# Patient Record
Sex: Male | Born: 1995 | Race: White | Hispanic: No | Marital: Single | Smoking: Current every day smoker
Health system: Southern US, Community
[De-identification: ages and names within clinical notes are randomized; demographics above are authoritative.]

## PROBLEM LIST (undated history)

## (undated) DIAGNOSIS — T8859XA Other complications of anesthesia, initial encounter: Secondary | ICD-10-CM

## (undated) DIAGNOSIS — L709 Acne, unspecified: Secondary | ICD-10-CM

## (undated) HISTORY — PX: WISDOM TOOTH EXTRACTION: SHX21

## (undated) HISTORY — DX: Acne, unspecified: L70.9

---
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
## 2015-08-21 ENCOUNTER — Ambulatory Visit (INDEPENDENT_AMBULATORY_CARE_PROVIDER_SITE_OTHER): Payer: Managed Care, Other (non HMO) | Admitting: Family Medicine

## 2015-08-21 VITALS — BP 122/68 | HR 95 | Temp 98.8°F | Resp 18 | Ht 71.5 in | Wt 151.0 lb

## 2015-08-21 DIAGNOSIS — Z720 Tobacco use: Secondary | ICD-10-CM | POA: Diagnosis not present

## 2015-08-21 DIAGNOSIS — K5900 Constipation, unspecified: Secondary | ICD-10-CM

## 2015-08-21 DIAGNOSIS — K640 First degree hemorrhoids: Secondary | ICD-10-CM

## 2015-08-21 NOTE — Progress Notes (Signed)
Subjective:    Patient ID: Matthew Galvan, male    DOB: 18-Feb-1996, 20 y.o.   MRN: [id number]  HPI This is a 20 yo male who presents today with hemorrhoid and constipation. He has had constipation for years. Took medication when he was young, now treats with high fiber foods. Has small BM most days, but can skip a day without BM. Good water intake. Occasional abdominal pain with constipation. Strains frequently. Rarely sees blood on stool. No tarry stools. No pain with bowel movements. Today he felt like he had pressure around his rectum and he looked with a mirror and saw buldging when he bore down. This caused him a great deal of anxiety and he passed out briefly at work. He reports he passed out at the ED the last time he had blood drawn. He gets very anxious regarding his health and possible illness.   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 He lives with his grandparents, his grandfather has been in poor health. His grandmother does the grocery shopping and he does most of the cooking. He eats fruits and vegetables, but not every day.   He is on probation and recently finished his community service. He is glad to be back to work full time. He is engaged.   Past Medical History  Diagnosis Date  . Acne    History reviewed. No pertinent past surgical history. History reviewed. No pertinent family history.  Social History  Substance Use Topics  . Smoking status: Current Every Day Smoker -- 0.50 packs/day    Types: Cigarettes  . Smokeless tobacco: None  . Alcohol Use: No    Review of Systems  Constitutional: Negative for fever, activity change, appetite change, fatigue and unexpected weight change.  Respiratory: Negative for shortness of breath.   Cardiovascular: Negative for chest pain.  Gastrointestinal: Positive for abdominal pain (occasionally with constipation) and constipation. Negative for nausea, vomiting and rectal pain.  Neurological: Negative  for dizziness, light-headedness and headaches.  Psychiatric/Behavioral: The patient is nervous/anxious.        Objective:   Physical Exam  Constitutional: He is oriented to person, place, and time. He appears well-developed and well-nourished.  HENT:  Galvan: Normocephalic and atraumatic.  Eyes: Conjunctivae are normal.  Neck: Normal range of motion. Neck supple.  Cardiovascular: Normal rate, regular rhythm and normal heart sounds.   Pulmonary/Chest: Effort normal and breath sounds normal.  Abdominal: Soft. Bowel sounds are normal. He exhibits no distension and no mass. There is no tenderness. There is no rebound and no guarding.  Genitourinary:  Small, soft external hemorrhoid.    Musculoskeletal: Normal range of motion.  Neurological: He is alert and oriented to person, place, and time.  Skin: Skin is warm and dry.  Psychiatric: He has a normal mood and affect. His behavior is normal. Judgment and thought content normal.  Vitals reviewed.     BP 122/68 mmHg  Pulse 95  Temp(Src) 98.8 F (37.1 C) (Oral)  Resp 18  Ht 5' 11.5" (1.816 m)  Wt 151 lb (68.493 kg)  BMI 20.77 kg/m2  SpO2 97%    Assessment & Plan:  1. First degree hemorrhoids - discussed causes and encouraged him to avoid constipation  2. Tobacco abuse - encouraged smoking cessation and provided written information regarding tips for success.   3. Constipation, unspecified constipation type - Provided written and verbal information regarding diagnosis and treatment. - encouraged him to take daily fiber to get  on better elimination scheduled, discussed ways to improve constipation - suggested labs, patient declined  - follow up PRN  Olean Reeeborah Weylin Plagge, FNP-BC  [facility]  08/24/2015 8:13 AM

## 2015-08-21 NOTE — Patient Instructions (Addendum)
Avoid getting constipated- drink at least 8 glasses of water daily, eat fruits and vegetables every day. Add fiber- can take Benefiber (generic is fine) that you mix in a glass of water. Take daily to see if you have improvement.   Manage stress- get enough sleep, eat regular healthy meals, exercise 4-5 times a week, take time for the things you enjoy  Consider picking a stop smoking date  Smoking Cessation, Tips for Success If you are ready to quit smoking, congratulations! You have chosen to help yourself be healthier. Cigarettes bring nicotine, tar, carbon monoxide, and other irritants into your body. Your lungs, heart, and blood vessels will be able to work better without these poisons. There are many different ways to quit smoking. Nicotine gum, nicotine patches, a nicotine inhaler, or nicotine nasal spray can help with physical craving. Hypnosis, support groups, and medicines help break the habit of smoking. WHAT THINGS CAN I DO TO MAKE QUITTING EASIER?  Here are some tips to help you quit for good:  Pick a date when you will quit smoking completely. Tell all of your friends and family about your plan to quit on that date.  Do not try to slowly cut down on the number of cigarettes you are smoking. Pick a quit date and quit smoking completely starting on that day.  Throw away all cigarettes.   Clean and remove all ashtrays from your home, work, and car.  On a card, write down your reasons for quitting. Carry the card with you and read it when you get the urge to smoke.  Cleanse your body of nicotine. Drink enough water and fluids to keep your urine clear or pale yellow. Do this after quitting to flush the nicotine from your body.  Learn to predict your moods. Do not let a bad situation be your excuse to have a cigarette. Some situations in your life might tempt you into wanting a cigarette.  Never have "just one" cigarette. It leads to wanting another and another. Remind yourself of  your decision to quit.  Change habits associated with smoking. If you smoked while driving or when feeling stressed, try other activities to replace smoking. Stand up when drinking your coffee. Brush your teeth after eating. Sit in a different chair when you read the paper. Avoid alcohol while trying to quit, and try to drink fewer caffeinated beverages. Alcohol and caffeine may urge you to smoke.  Avoid foods and drinks that can trigger a desire to smoke, such as sugary or spicy foods and alcohol.  Ask people who smoke not to smoke around you.  Have something planned to do right after eating or having a cup of coffee. For example, plan to take a walk or exercise.  Try a relaxation exercise to calm you down and decrease your stress. Remember, you may be tense and nervous for the first 2 weeks after you quit, but this will pass.  Find new activities to keep your hands busy. Play with a pen, coin, or rubber band. Doodle or draw things on paper.  Brush your teeth right after eating. This will help cut down on the craving for the taste of tobacco after meals. You can also try mouthwash.   Use oral substitutes in place of cigarettes. Try using lemon drops, carrots, cinnamon sticks, or chewing gum. Keep them handy so they are available when you have the urge to smoke.  When you have the urge to smoke, try deep breathing.  Designate your home  as a nonsmoking area.  If you are a heavy smoker, ask your health care provider about a prescription for nicotine chewing gum. It can ease your withdrawal from nicotine.  Reward yourself. Set aside the cigarette money you save and buy yourself something nice.  Look for support from others. Join a support group or smoking cessation program. Ask someone at home or at work to help you with your plan to quit smoking.  Always ask yourself, "Do I need this cigarette or is this just a reflex?" Tell yourself, "Today, I choose not to smoke," or "I do not want to  smoke." You are reminding yourself of your decision to quit.  Do not replace cigarette smoking with electronic cigarettes (commonly called e-cigarettes). The safety of e-cigarettes is unknown, and some may contain harmful chemicals.  If you relapse, do not give up! Plan ahead and think about what you will do the next time you get the urge to smoke. HOW WILL I FEEL WHEN I QUIT SMOKING? You may have symptoms of withdrawal because your body is used to nicotine (the addictive substance in cigarettes). You may crave cigarettes, be irritable, feel very hungry, cough often, get headaches, or have difficulty concentrating. The withdrawal symptoms are only temporary. They are strongest when you first quit but will go away within 10-14 days. When withdrawal symptoms occur, stay in control. Think about your reasons for quitting. Remind yourself that these are signs that your body is healing and getting used to being without cigarettes. Remember that withdrawal symptoms are easier to treat than the major diseases that smoking can cause.  Even after the withdrawal is over, expect periodic urges to smoke. However, these cravings are generally short lived and will go away whether you smoke or not. Do not smoke! WHAT RESOURCES ARE AVAILABLE TO HELP ME QUIT SMOKING? Your health care provider can direct you to community resources or hospitals for support, which may include:  Group support.  Education.  Hypnosis.  Therapy.   This information is not intended to replace advice given to you by your health care provider. Make sure you discuss any questions you have with your health care provider.   Document Released: 12/19/2003 Document Revised: 04/12/2014 Document Reviewed: 09/07/2012 Elsevier Interactive Patient Education 2016 [employer].   Constipation, Adult Constipation is when a person has fewer than three bowel movements a week, has difficulty having a bowel movement, or has stools that are dry, hard,  or larger than normal. As people grow older, constipation is more common. A low-fiber diet, not taking in enough fluids, and taking certain medicines may make constipation worse.  CAUSES   Certain medicines, such as antidepressants, pain medicine, iron supplements, antacids, and water pills.   Certain diseases, such as diabetes, irritable bowel syndrome (IBS), thyroid disease, or depression.   Not drinking enough water.   Not eating enough fiber-rich foods.   Stress or travel.   Lack of physical activity or exercise.   Ignoring the urge to have a bowel movement.   Using laxatives too much.  SIGNS AND SYMPTOMS   Having fewer than three bowel movements a week.   Straining to have a bowel movement.   Having stools that are hard, dry, or larger than normal.   Feeling full or bloated.   Pain in the lower abdomen.   Not feeling relief after having a bowel movement.  DIAGNOSIS  Your health care provider will take a medical history and perform a physical exam. Further testing may  be done for severe constipation. Some tests may include:  A barium enema X-ray to examine your rectum, colon, and, sometimes, your small intestine.   A sigmoidoscopy to examine your lower colon.   A colonoscopy to examine your entire colon. TREATMENT  Treatment will depend on the severity of your constipation and what is causing it. Some dietary treatments include drinking more fluids and eating more fiber-rich foods. Lifestyle treatments may include regular exercise. If these diet and lifestyle recommendations do not help, your health care provider may recommend taking over-the-counter laxative medicines to help you have bowel movements. Prescription medicines may be prescribed if over-the-counter medicines do not work.  HOME CARE INSTRUCTIONS   Eat foods that have a lot of fiber, such as fruits, vegetables, whole grains, and beans.  Limit foods high in fat and processed sugars, such as  french fries, hamburgers, cookies, candies, and soda.   A fiber supplement may be added to your diet if you cannot get enough fiber from foods.   Drink enough fluids to keep your urine clear or pale yellow.   Exercise regularly or as directed by your health care provider.   Go to the restroom when you have the urge to go. Do not hold it.   Only take over-the-counter or prescription medicines as directed by your health care provider. Do not take other medicines for constipation without talking to your health care provider first.  SEEK IMMEDIATE MEDICAL CARE IF:   You have bright red blood in your stool.   Your constipation lasts for more than 4 days or gets worse.   You have abdominal or rectal pain.   You have thin, pencil-like stools.   You have unexplained weight loss. MAKE SURE YOU:   Understand these instructions.  Will watch your condition.  Will get help right away if you are not doing well or get worse.   This information is not intended to replace advice given to you by your health care provider. Make sure you discuss any questions you have with your health care provider.   Document Released: 12/19/2003 Document Revised: 04/12/2014 Document Reviewed: 01/01/2013 Elsevier Interactive Patient Education 2016 [employer].  Hemorrhoids Hemorrhoids are swollen veins around the rectum or anus. There are two types of hemorrhoids:   Internal hemorrhoids. These occur in the veins just inside the rectum. They may poke through to the outside and become irritated and painful.  External hemorrhoids. These occur in the veins outside the anus and can be felt as a painful swelling or hard lump near the anus. CAUSES  Pregnancy.   Obesity.   Constipation or diarrhea.   Straining to have a bowel movement.   Sitting for long periods on the toilet.  Heavy lifting or other activity that caused you to strain.  Anal intercourse. SYMPTOMS   Pain.   Anal  itching or irritation.   Rectal bleeding.   Fecal leakage.   Anal swelling.   One or more lumps around the anus.  DIAGNOSIS  Your caregiver may be able to diagnose hemorrhoids by visual examination. Other examinations or tests that may be performed include:   Examination of the rectal area with a gloved hand (digital rectal exam).   Examination of anal canal using a small tube (scope).   A blood test if you have lost a significant amount of blood.  A test to look inside the colon (sigmoidoscopy or colonoscopy). TREATMENT Most hemorrhoids can be treated at home. However, if symptoms do not seem  to be getting better or if you have a lot of rectal bleeding, your caregiver may perform a procedure to help make the hemorrhoids get smaller or remove them completely. Possible treatments include:   Placing a rubber band at the base of the hemorrhoid to cut off the circulation (rubber band ligation).   Injecting a chemical to shrink the hemorrhoid (sclerotherapy).   Using a tool to burn the hemorrhoid (infrared light therapy).   Surgically removing the hemorrhoid (hemorrhoidectomy).   Stapling the hemorrhoid to block blood flow to the tissue (hemorrhoid stapling).  HOME CARE INSTRUCTIONS   Eat foods with fiber, such as whole grains, beans, nuts, fruits, and vegetables. Ask your doctor about taking products with added fiber in them (fibersupplements).  Increase fluid intake. Drink enough water and fluids to keep your urine clear or pale yellow.   Exercise regularly.   Go to the bathroom when you have the urge to have a bowel movement. Do not wait.   Avoid straining to have bowel movements.   Keep the anal area dry and clean. Use wet toilet paper or moist towelettes after a bowel movement.   Medicated creams and suppositories may be used or applied as directed.   Only take over-the-counter or prescription medicines as directed by your caregiver.   Take warm  sitz baths for 15-20 minutes, 3-4 times a day to ease pain and discomfort.   Place ice packs on the hemorrhoids if they are tender and swollen. Using ice packs between sitz baths may be helpful.   Put ice in a plastic bag.   Place a towel between your skin and the bag.   Leave the ice on for 15-20 minutes, 3-4 times a day.   Do not use a donut-shaped pillow or sit on the toilet for long periods. This increases blood pooling and pain.  SEEK MEDICAL CARE IF:  You have increasing pain and swelling that is not controlled by treatment or medicine.  You have uncontrolled bleeding.  You have difficulty or you are unable to have a bowel movement.  You have pain or inflammation outside the area of the hemorrhoids. MAKE SURE YOU:  Understand these instructions.  Will watch your condition.  Will get help right away if you are not doing well or get worse.   This information is not intended to replace advice given to you by your health care provider. Make sure you discuss any questions you have with your health care provider.   Document Released: 03/19/2000 Document Revised: 03/08/2012 Document Reviewed: 01/25/2012 Elsevier Interactive Patient Education 2016 [employer]     IF you received an x-ray today, you will receive an invoice from [facility]. Please contact [facility] at [phone] with questions or concerns regarding your invoice.   IF you received labwork today, you will receive an invoice from [facility]. Please contact [facility] at [phone] with questions or concerns regarding your invoice.   Our billing staff will not be able to assist you with questions regarding bills from these companies.  You will be contacted with the lab results as soon as they are available. The fastest way to get your results is to activate your My Chart account. Instructions are located on the last page of this paperwork. If you have not  heard from us regarding the results in 2 weeks, please contact this office.    -
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
## 2019-10-29 ENCOUNTER — Encounter: Payer: Self-pay | Admitting: Emergency Medicine

## 2019-10-29 DIAGNOSIS — F17210 Nicotine dependence, cigarettes, uncomplicated: Secondary | ICD-10-CM | POA: Diagnosis not present

## 2019-10-29 DIAGNOSIS — K047 Periapical abscess without sinus: Secondary | ICD-10-CM | POA: Diagnosis not present

## 2019-10-29 DIAGNOSIS — R220 Localized swelling, mass and lump, head: Secondary | ICD-10-CM | POA: Diagnosis present

## 2019-10-29 NOTE — ED Triage Notes (Signed)
Pt c/o swelling underneath the top lip x1 day. Pt denies drainage as well as fever. Area is swollen with no redness.

## 2019-10-30 ENCOUNTER — Other Ambulatory Visit: Payer: Self-pay

## 2019-10-30 ENCOUNTER — Emergency Department
Admission: EM | Admit: 2019-10-30 | Discharge: 2019-10-30 | Disposition: A | Discharge status: Home / Self Care | Payer: No Typology Code available for payment source | Attending: Emergency Medicine | Admitting: Emergency Medicine

## 2019-10-30 DIAGNOSIS — K047 Periapical abscess without sinus: Secondary | ICD-10-CM

## 2019-10-30 MED ORDER — AMOXICILLIN 500 MG PO CAPS
500.0000 mg | ORAL_CAPSULE | Freq: Once | ORAL | Status: AC
  Administered 2019-10-30: 500 mg via ORAL
  Filled 2019-10-30: qty 1

## 2019-10-30 MED ORDER — TRAMADOL HCL 50 MG PO TABS: 50.0000 mg | ORAL_TABLET | Freq: Two times a day (BID) | ORAL | 0 refills | Status: AC

## 2019-10-30 MED ORDER — AMOXICILLIN 500 MG PO CAPS: 500.0000 mg | ORAL_CAPSULE | Freq: Three times a day (TID) | ORAL | 0 refills | Status: DC

## 2019-10-30 MED ORDER — IBUPROFEN 800 MG PO TABS: 800.0000 mg | ORAL_TABLET | Freq: Once | ORAL | Status: DC

## 2019-10-30 MED ORDER — ACETAMINOPHEN 325 MG PO TABS
650.0000 mg | ORAL_TABLET | Freq: Once | ORAL | Status: AC
  Administered 2019-10-30: 650 mg via ORAL
  Filled 2019-10-30: qty 2

## 2019-10-30 MED ORDER — LIDOCAINE-EPINEPHRINE 2 %-1:100000 IJ SOLN
1.7000 mL | Freq: Once | INTRAMUSCULAR | Status: AC
  Administered 2019-10-30: 1.7 mL

## 2019-10-30 NOTE — ED Provider Notes (Signed)
[facility] Emergency Department Provider Note ____________________________________________  Time seen: 0715  I have reviewed the triage vital signs and the nursing notes.  HISTORY  Chief Complaint  Oral Swelling  HPI Matthew Galvan is a 24 y.o. male presents himself to the ED for evaluation of dental pain and facial swelling.  Patient describes onset yesterday, of some swelling and fullness to the upper primary incisors just under the upper lip.  He denies any fever, chills, or sweats.  He also denies any difficulty swallowing, breathing, or controlling secretions.  Patient is without reports of recent dental work or facial trauma.  He denies any previous dental abscesses.   Past Medical History:  Diagnosis Date  . Acne     There are no problems to display for this patient.   No past surgical history on file.  Prior to Admission medications   Medication Sig Start Date End Date Taking? Authorizing Provider  amoxicillin (AMOXIL) 500 MG capsule Take 1 capsule (500 mg total) by mouth 3 (three) times daily. 10/30/19   Breyah Akhter, Charlesetta Ivory, PA-C  traMADol (ULTRAM) 50 MG tablet Take 1 tablet (50 mg total) by mouth 2 (two) times daily for 3 days. 10/30/19 11/02/19  Angenette Daily, Charlesetta Ivory, PA-C    Allergies Patient has no known allergies.  No family history on file.  Social History Social History   Tobacco Use  . Smoking status: Current Every Day Smoker    Packs/day: 0.50    Types: Cigarettes  Substance Use Topics  . Alcohol use: No  . Drug use: No    Review of Systems  Constitutional: Negative for fever. Eyes: Negative for visual changes. ENT: Negative for sore throat. Dental pain/facial swelling  Cardiovascular: Negative for chest pain. Respiratory: Negative for shortness of breath. Musculoskeletal: Negative for back pain. Skin: Negative for rash. Neurological: Negative for headaches, focal weakness or  numbness. ____________________________________________  PHYSICAL EXAM:  VITAL SIGNS: ED Triage Vitals [10/29/19 2117]  Enc Vitals Group     BP (!) 143/78     Pulse Rate 80     Resp 16     Temp 99.3 F (37.4 C)     Temp Source Oral     SpO2 99 %     Weight 160 lb (72.6 kg)     Height 6\' 1"  (1.854 m)     Head Circumference      Peak Flow      Pain Score      Pain Loc      Pain Edu?      Excl. in GC?     Constitutional: Alert and oriented. Well appearing and in no distress. Head: Normocephalic and atraumatic. Eyes: Conjunctivae are normal. PERRL. Normal extraocular movements Nose: No congestion/rhinorrhea/epistaxis. Mouth/Throat: Mucous membranes are moist.  Uvula is midline and tonsils are flat.  No oropharyngeal lesions are appreciated.  Patient with fullness and swelling to the buccal space at the upper frenulum.  No spontaneous purulent drainage is appreciated. Neck: Supple. No thyromegaly. Hematological/Lymphatic/Immunological: No cervical lymphadenopathy. Cardiovascular: Normal rate, regular rhythm. Normal distal pulses. Respiratory: Normal respiratory effort. No wheezes/rales/rhonchi. Gastrointestinal: Soft and nontender. No distention. Skin:  Skin is warm, dry and intact. No rash noted. ____________________________________________  PROCEDURES  Tylenol 650 mg PO Amoxicillin 500 mg PO  . Incision and Drainage  Date/Time: 10/30/2019 7:22 AM Performed by: 11/01/2019, PA-C Authorized by: Lissa Hoard, PA-C   Consent:    Consent obtained:  Verbal  Consent given by:  Patient   Risks discussed:  Bleeding and pain   Alternatives discussed:  Alternative treatment Location:    Type:  Abscess   Location:  Mouth   Mouth location:  Alveolar process Procedure type:    Complexity:  Simple Procedure details:    Needle aspiration: no     Incision types:  Stab incision   Incision depth:  Submucosal   Scalpel blade:  11   Drainage:  Bloody    Drainage amount:  Scant   Wound treatment:  Wound left open   Packing materials:  None Post-procedure details:    Patient tolerance of procedure:  Tolerated well, no immediate complications  ____________________________________________  INITIAL IMPRESSION / ASSESSMENT AND PLAN / ED COURSE  Patient with ED evaluation of facial swelling to the upper gumline. He was found to have a focal periapical abscess to the primary left incisor. A local I&D procedure was performed. He was discharged with amoxicillin and Ultram. He is referred to a local dental provider as needed.   LADALE SHERBURN was evaluated in Emergency Department on 10/30/2019 for the symptoms described in the history of present illness. He was evaluated in the context of the global COVID-19 pandemic, which necessitated consideration that the patient might be at risk for infection with the SARS-CoV-2 virus that causes COVID-19. Institutional protocols and algorithms that pertain to the evaluation of patients at risk for COVID-19 are in a state of rapid change based on information released by regulatory bodies including the CDC and federal and state organizations. These policies and algorithms were followed during the patient's care in the ED.  I reviewed the patient's prescription history over the last 12 months in the multi-state controlled substances database(s) that includes [place], [place], [place], [employer], [place], [place], [place], [place], [place], [place], [place], [place], [place], and [place].  Results were notable for no current RX. ____________________________________________  FINAL CLINICAL IMPRESSION(S) / ED DIAGNOSES  Final diagnoses:  Dental abscess      Lissa Hoard, PA-C 10/30/19 1503    Jene Every, MD 10/30/19 1506

## 2019-10-30 NOTE — Discharge Instructions (Signed)
Continue to rinse the mouth daily with warm salt water.  Use a soft toothbrush or clean his teeth twice daily.  Take antibiotic as directed and the pain medicine as needed.  Follow-up with a local dental provider for ongoing symptoms.  OPTIONS FOR DENTAL FOLLOW UP CARE  [place] [facility] and [facility] Services - Local Safety Net Dental Clinics TripDoors.com.htm   [facility] [phone])  [facility] [phone])  [place] [phone] ext [phone])  [facility] [phone])  [facility] [phone]) This clinic caters to the indigent population and is on a lottery system. Location: [employer] [facility], [employer], [address] [place] Clinic Hours: Wednesdays from 6pm - 9pm, patients seen by a lottery system. For dates, call or go to [email or web address] Services: Cleanings, fillings and simple extractions. Payment Options: DENTAL WORK IS FREE OF CHARGE. Bring proof of income or support. Best way to get seen: Arrive at 5:15 pm - this is a lottery, NOT first come/first serve, so arriving earlier will not increase your chances of being seen.     [facility] [phone] Select option 1 for emergencies   Location: [facility], [place], [address] [place] Clinic Hours: No walk-ins accepted - call the day before to schedule an appointment. Check in times are 9:30 am and 1:30 pm. Services: Simple extractions, temporary fillings, pulpectomy/pulp debridement, uncomplicated abscess drainage. Payment Options: PAYMENT IS DUE AT THE TIME OF SERVICE.  Fee is usually $100-200, additional surgical procedures (e.g. abscess drainage) may be extra. Cash, checks, Visa/MasterCard accepted.  Can file Medicaid if patient is covered for dental - patient should call case worker to check. No  discount for [facility] patients. Best way to get seen: MUST call the day before and get onto the schedule. Can usually be seen the next 1-2 days. No walk-ins accepted.     [facility] [phone]   Location: [facility], [address], [place] Clinic Hours: M, W, Th, F 8am or 1:30pm, Tues 9a or 1:30 - first come/first served. Services: Simple extractions, temporary fillings, uncomplicated abscess drainage.  You do not need to be an [facility] resident. Payment Options: PAYMENT IS DUE AT THE TIME OF SERVICE. Dental insurance, otherwise sliding scale - bring proof of income or support. Depending on income and treatment needed, cost is usually $50-200. Best way to get seen: Arrive early as it is first come/first served.     [facility] [phone]   Location: [address] Clinic Hours: Mon-Thu 8a-5p Services: Most basic dental services including extractions and fillings. Payment Options: PAYMENT IS DUE AT THE TIME OF SERVICE. Sliding scale, up to 50% off - bring proof if income or support. Medicaid with dental option accepted. Best way to get seen: Call to schedule an appointment, can usually be seen within 2 weeks OR they will try to see walk-ins - show up at 8a or 2p (you may have to wait).     [facility] [phone] [place] RESIDENTS ONLY   Location: [facility], [address] [place], [place] [address] Clinic Hours: By appointment only. Monday - Thursday 8am-5pm, Friday 8am-12pm Services: Cleanings, fillings, extractions. Payment Options: PAYMENT IS DUE AT THE TIME OF SERVICE. Cash, Visa or MasterCard. Sliding scale - $30 minimum per service. Best way to get seen: Come in to office, complete packet and make an  appointment - need proof of income or support monies for each household member and proof of [facility]  residence. Usually takes about a month to get in.     [facility] [phone]   Location: [address] [facility] Clinic Hours: Walk-in Urgent Care Dental Services are offered Monday-Friday mornings only. The numbers of emergencies accepted daily is limited to the number of providers available. Maximum 15 - Mondays, Wednesdays & Thursdays Maximum 10 - Tuesdays & Fridays Services: You do not need to be a [facility] resident to be seen for a dental emergency. Emergencies are defined as pain, swelling, abnormal bleeding, or dental trauma. Walkins will receive x-rays if needed. NOTE: Dental cleaning is not an emergency. Payment Options: PAYMENT IS DUE AT THE TIME OF SERVICE. Minimum co-pay is $40.00 for uninsured patients. Minimum co-pay is $3.00 for Medicaid with dental coverage. Dental Insurance is accepted and must be presented at time of visit. Medicare does not cover dental. Forms of payment: Cash, credit card, checks. Best way to get seen: If not previously registered with the clinic, walk-in dental registration begins at 7:15 am and is on a first come/first serve basis. If previously registered with the clinic, call to make an appointment.     [facility] [phone] [place] RESIDENTS ONLY   Location: [address] [place], [place] Clinic Hours: Mon-Thu 10a-2p Services: Extractions only! Payment Options: FREE (donations accepted) - bring proof of income or support Best way to get seen: Call and schedule an appointment OR come at 8am on the 1st Monday of every month (except for holidays) when it is first come/first served.     [facility] [phone]   Location: [address] [place], [place] Clinic Hours: Friday mornings Services, Payment Options, Best way to get seen: Call for info

## 2019-10-30 NOTE — ED Notes (Signed)
Pt sitting in lobby, sleeping soundly with no distress noted; pt updated on wait time; pt refuses tylenol when offered and st that he is leaving if he is not back in a room within 

## 2019-10-30 NOTE — ED Notes (Signed)
See triage note  Presents with pain and swelling to mid upper gumline  States pain started on Sunday  No fever

## 2021-04-11 ENCOUNTER — Encounter (HOSPITAL_BASED_OUTPATIENT_CLINIC_OR_DEPARTMENT_OTHER): Payer: Self-pay | Admitting: *Deleted

## 2021-04-11 ENCOUNTER — Other Ambulatory Visit: Payer: Self-pay

## 2021-04-11 ENCOUNTER — Emergency Department (HOSPITAL_BASED_OUTPATIENT_CLINIC_OR_DEPARTMENT_OTHER): Payer: No Typology Code available for payment source

## 2021-04-11 ENCOUNTER — Emergency Department (HOSPITAL_BASED_OUTPATIENT_CLINIC_OR_DEPARTMENT_OTHER)
Admission: EM | Admit: 2021-04-11 | Discharge: 2021-04-11 | Disposition: A | Discharge status: Home / Self Care | Payer: No Typology Code available for payment source | Attending: Emergency Medicine | Admitting: Emergency Medicine

## 2021-04-11 DIAGNOSIS — Z79899 Other long term (current) drug therapy: Secondary | ICD-10-CM | POA: Insufficient documentation

## 2021-04-11 DIAGNOSIS — S02602A Fracture of unspecified part of body of left mandible, initial encounter for closed fracture: Secondary | ICD-10-CM | POA: Diagnosis not present

## 2021-04-11 DIAGNOSIS — S0993XA Unspecified injury of face, initial encounter: Secondary | ICD-10-CM | POA: Diagnosis present

## 2021-04-11 DIAGNOSIS — S02609A Fracture of mandible, unspecified, initial encounter for closed fracture: Secondary | ICD-10-CM

## 2021-04-11 LAB — CBC WITH DIFFERENTIAL/PLATELET
Abs Immature Granulocytes: 0.09 10*3/uL — ABNORMAL HIGH (ref 0.00–0.07)
Basophils Absolute: 0.1 10*3/uL (ref 0.0–0.1)
Basophils Relative: 1 %
Eosinophils Absolute: 0.2 10*3/uL (ref 0.0–0.5)
Eosinophils Relative: 2 %
HCT: 44.7 % (ref 39.0–52.0)
Hemoglobin: 15 g/dL (ref 13.0–17.0)
Immature Granulocytes: 1 %
Lymphocytes Relative: 20 %
Lymphs Abs: 2.2 10*3/uL (ref 0.7–4.0)
MCH: 28.4 pg (ref 26.0–34.0)
MCHC: 33.6 g/dL (ref 30.0–36.0)
MCV: 84.7 fL (ref 80.0–100.0)
Monocytes Absolute: 1.1 10*3/uL — ABNORMAL HIGH (ref 0.1–1.0)
Monocytes Relative: 10 %
Neutro Abs: 7.4 10*3/uL (ref 1.7–7.7)
Neutrophils Relative %: 66 %
Platelets: 300 10*3/uL (ref 150–400)
RBC: 5.28 MIL/uL (ref 4.22–5.81)
RDW: 12.9 % (ref 11.5–15.5)
WBC: 11 10*3/uL — ABNORMAL HIGH (ref 4.0–10.5)
nRBC: 0 % (ref 0.0–0.2)

## 2021-04-11 LAB — BASIC METABOLIC PANEL
Anion gap: 8 (ref 5–15)
BUN: 16 mg/dL (ref 6–20)
CO2: 26 mmol/L (ref 22–32)
Calcium: 9.2 mg/dL (ref 8.9–10.3)
Chloride: 105 mmol/L (ref 98–111)
Creatinine, Ser: 0.93 mg/dL (ref 0.61–1.24)
GFR, Estimated: >60 mL/min (ref >60)
Glucose, Bld: 100 mg/dL — ABNORMAL HIGH (ref 70–99)
Potassium: 3.9 mmol/L (ref 3.5–5.1)
Sodium: 139 mmol/L (ref 135–145)

## 2021-04-11 MED ORDER — HYDROMORPHONE HCL 1 MG/ML IJ SOLN
1.0000 mg | Freq: Once | INTRAMUSCULAR | Status: AC
  Administered 2021-04-11: 1 mg via INTRAVENOUS
  Filled 2021-04-11: qty 1

## 2021-04-11 MED ORDER — CEPHALEXIN 500 MG PO CAPS: 500.0000 mg | ORAL_CAPSULE | Freq: Four times a day (QID) | ORAL | 0 refills | Status: DC

## 2021-04-11 MED ORDER — FENTANYL CITRATE PF 50 MCG/ML IJ SOSY
50.0000 ug | PREFILLED_SYRINGE | Freq: Once | INTRAMUSCULAR | Status: AC
  Administered 2021-04-11: 50 ug via INTRAVENOUS
  Filled 2021-04-11: qty 1

## 2021-04-11 MED ORDER — OXYCODONE-ACETAMINOPHEN 5-325 MG PO TABS: 1.0000 | ORAL_TABLET | ORAL | 0 refills | Status: DC | PRN

## 2021-04-11 MED ORDER — ONDANSETRON HCL 4 MG/2ML IJ SOLN
4.0000 mg | Freq: Once | INTRAMUSCULAR | Status: AC
  Administered 2021-04-11: 4 mg via INTRAVENOUS
  Filled 2021-04-11: qty 2

## 2021-04-11 NOTE — ED Notes (Signed)
Family at bedside. 

## 2021-04-11 NOTE — ED Notes (Addendum)
Paged  [place] 17:38, Repaged 18:09
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
## 2021-04-13 ENCOUNTER — Other Ambulatory Visit: Payer: Self-pay

## 2021-04-13 ENCOUNTER — Encounter (HOSPITAL_BASED_OUTPATIENT_CLINIC_OR_DEPARTMENT_OTHER): Payer: Self-pay | Admitting: Plastic Surgery

## 2021-04-13 DIAGNOSIS — S02601B Fracture of unspecified part of body of right mandible, initial encounter for open fracture: Secondary | ICD-10-CM | POA: Insufficient documentation

## 2021-04-13 DIAGNOSIS — S02642A Fracture of ramus of left mandible, initial encounter for closed fracture: Secondary | ICD-10-CM | POA: Insufficient documentation
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
## 2021-04-14 ENCOUNTER — Ambulatory Visit (HOSPITAL_BASED_OUTPATIENT_CLINIC_OR_DEPARTMENT_OTHER): Payer: No Typology Code available for payment source | Admitting: Anesthesiology

## 2021-04-14 ENCOUNTER — Encounter (HOSPITAL_BASED_OUTPATIENT_CLINIC_OR_DEPARTMENT_OTHER)
Admission: RE | Disposition: A | Discharge status: Home / Self Care | Payer: Self-pay | Source: Home / Self Care | Attending: Plastic Surgery

## 2021-04-14 ENCOUNTER — Other Ambulatory Visit: Payer: Self-pay

## 2021-04-14 ENCOUNTER — Encounter (HOSPITAL_BASED_OUTPATIENT_CLINIC_OR_DEPARTMENT_OTHER): Payer: Self-pay | Admitting: Plastic Surgery

## 2021-04-14 ENCOUNTER — Ambulatory Visit (HOSPITAL_BASED_OUTPATIENT_CLINIC_OR_DEPARTMENT_OTHER)
Admission: RE | Admit: 2021-04-14 | Discharge: 2021-04-14 | Disposition: A | Discharge status: Home / Self Care | Payer: No Typology Code available for payment source | Attending: Plastic Surgery | Admitting: Plastic Surgery

## 2021-04-14 DIAGNOSIS — S02642A Fracture of ramus of left mandible, initial encounter for closed fracture: Secondary | ICD-10-CM | POA: Insufficient documentation

## 2021-04-14 DIAGNOSIS — X58XXXA Exposure to other specified factors, initial encounter: Secondary | ICD-10-CM | POA: Insufficient documentation

## 2021-04-14 DIAGNOSIS — F17200 Nicotine dependence, unspecified, uncomplicated: Secondary | ICD-10-CM | POA: Insufficient documentation

## 2021-04-14 DIAGNOSIS — S02601A Fracture of unspecified part of body of right mandible, initial encounter for closed fracture: Secondary | ICD-10-CM | POA: Insufficient documentation

## 2021-04-14 HISTORY — PX: ORIF MANDIBULAR FRACTURE: SHX2127

## 2021-04-14 SURGERY — OPEN REDUCTION INTERNAL FIXATION (ORIF) MANDIBULAR FRACTURE
Anesthesia: General | Site: Mouth | Laterality: Bilateral

## 2021-04-14 MED ORDER — SUGAMMADEX SODIUM 500 MG/5ML IV SOLN
INTRAVENOUS | Status: DC | PRN
Start: 2021-04-14 — End: 2021-04-14
  Administered 2021-04-14: 200 mg via INTRAVENOUS

## 2021-04-14 MED ORDER — LIDOCAINE-EPINEPHRINE 1 %-1:100000 IJ SOLN
INTRAMUSCULAR | Status: DC | PRN
  Administered 2021-04-14: 7 mL

## 2021-04-14 MED ORDER — FENTANYL CITRATE (PF) 100 MCG/2ML IJ SOLN
INTRAMUSCULAR | Status: AC
  Filled 2021-04-14: qty 2

## 2021-04-14 MED ORDER — ONDANSETRON HCL 4 MG/2ML IJ SOLN
INTRAMUSCULAR | Status: AC
  Filled 2021-04-14: qty 2

## 2021-04-14 MED ORDER — OXYCODONE HCL 5 MG PO TABS: 5.0000 mg | ORAL_TABLET | ORAL | 0 refills | Status: DC | PRN

## 2021-04-14 MED ORDER — ROCURONIUM BROMIDE 10 MG/ML (PF) SYRINGE
PREFILLED_SYRINGE | INTRAVENOUS | Status: AC
  Filled 2021-04-14: qty 10

## 2021-04-14 MED ORDER — ONDANSETRON HCL 4 MG/2ML IJ SOLN
INTRAMUSCULAR | Status: DC | PRN
Start: 2021-04-14 — End: 2021-04-14
  Administered 2021-04-14: 4 mg via INTRAVENOUS

## 2021-04-14 MED ORDER — OXYCODONE HCL 5 MG PO TABS: 5.0000 mg | ORAL_TABLET | Freq: Once | ORAL | Status: DC | PRN

## 2021-04-14 MED ORDER — MEPERIDINE HCL 25 MG/ML IJ SOLN: 6.2500 mg | INTRAMUSCULAR | Status: DC | PRN

## 2021-04-14 MED ORDER — DEXMEDETOMIDINE (PRECEDEX) IN NS 20 MCG/5ML (4 MCG/ML) IV SYRINGE
PREFILLED_SYRINGE | INTRAVENOUS | Status: AC
  Filled 2021-04-14: qty 5

## 2021-04-14 MED ORDER — CEFAZOLIN SODIUM-DEXTROSE 2-4 GM/100ML-% IV SOLN
2.0000 g | INTRAVENOUS | Status: AC
  Administered 2021-04-14: 2 g via INTRAVENOUS

## 2021-04-14 MED ORDER — AMISULPRIDE (ANTIEMETIC) 5 MG/2ML IV SOLN: 10.0000 mg | Freq: Once | INTRAVENOUS | Status: DC | PRN

## 2021-04-14 MED ORDER — DEXAMETHASONE SODIUM PHOSPHATE 4 MG/ML IJ SOLN
INTRAMUSCULAR | Status: DC | PRN
  Administered 2021-04-14: 5 mg via INTRAVENOUS

## 2021-04-14 MED ORDER — FENTANYL CITRATE (PF) 100 MCG/2ML IJ SOLN
50.0000 ug | INTRAMUSCULAR | Status: DC | PRN
  Administered 2021-04-14 (×2): 50 ug via INTRAVENOUS

## 2021-04-14 MED ORDER — HYDROMORPHONE HCL 1 MG/ML IJ SOLN
0.2500 mg | INTRAMUSCULAR | Status: DC | PRN
  Administered 2021-04-14 (×2): 0.5 mg via INTRAVENOUS

## 2021-04-14 MED ORDER — LACTATED RINGERS IV SOLN: INTRAVENOUS | Status: DC

## 2021-04-14 MED ORDER — MIDAZOLAM HCL 5 MG/5ML IJ SOLN
INTRAMUSCULAR | Status: DC | PRN
  Administered 2021-04-14: 2 mg via INTRAVENOUS

## 2021-04-14 MED ORDER — OXYCODONE HCL 5 MG/5ML PO SOLN: 5.0000 mg | Freq: Once | ORAL | Status: DC | PRN

## 2021-04-14 MED ORDER — SUCCINYLCHOLINE CHLORIDE 200 MG/10ML IV SOSY
PREFILLED_SYRINGE | INTRAVENOUS | Status: AC
  Filled 2021-04-14: qty 10

## 2021-04-14 MED ORDER — SUGAMMADEX SODIUM 200 MG/2ML IV SOLN
INTRAVENOUS | Status: DC | PRN
  Administered 2021-04-14: 200 mg via INTRAVENOUS

## 2021-04-14 MED ORDER — HYDROMORPHONE HCL 1 MG/ML IJ SOLN
INTRAMUSCULAR | Status: AC
  Filled 2021-04-14: qty 0.5

## 2021-04-14 MED ORDER — FENTANYL CITRATE (PF) 100 MCG/2ML IJ SOLN
INTRAMUSCULAR | Status: DC | PRN
  Administered 2021-04-14: 100 ug via INTRAVENOUS
  Administered 2021-04-14 (×2): 50 ug via INTRAVENOUS

## 2021-04-14 MED ORDER — DEXMEDETOMIDINE (PRECEDEX) IN NS 20 MCG/5ML (4 MCG/ML) IV SYRINGE
PREFILLED_SYRINGE | INTRAVENOUS | Status: DC | PRN
  Administered 2021-04-14 (×3): 4 ug via INTRAVENOUS
  Administered 2021-04-14: 8 ug via INTRAVENOUS

## 2021-04-14 MED ORDER — MIDAZOLAM HCL 2 MG/2ML IJ SOLN
INTRAMUSCULAR | Status: AC
  Filled 2021-04-14: qty 2

## 2021-04-14 MED ORDER — PROPOFOL 10 MG/ML IV BOLUS
INTRAVENOUS | Status: DC | PRN
  Administered 2021-04-14: 200 mg via INTRAVENOUS

## 2021-04-14 MED ORDER — DEXAMETHASONE SODIUM PHOSPHATE 10 MG/ML IJ SOLN
INTRAMUSCULAR | Status: AC
  Filled 2021-04-14: qty 1

## 2021-04-14 MED ORDER — ROCURONIUM BROMIDE 100 MG/10ML IV SOLN
INTRAVENOUS | Status: DC | PRN
  Administered 2021-04-14: 50 mg via INTRAVENOUS

## 2021-04-14 MED ORDER — BACITRACIN 500 UNIT/GM EX OINT
TOPICAL_OINTMENT | CUTANEOUS | Status: DC | PRN
  Administered 2021-04-14: 1 via TOPICAL

## 2021-04-14 MED ORDER — BACITRACIN ZINC 500 UNIT/GM EX OINT
TOPICAL_OINTMENT | CUTANEOUS | Status: AC
  Filled 2021-04-14: qty 0.9

## 2021-04-14 MED ORDER — PROMETHAZINE HCL 25 MG/ML IJ SOLN: 6.2500 mg | INTRAMUSCULAR | Status: DC | PRN

## 2021-04-14 MED ORDER — LIDOCAINE 2% (20 MG/ML) 5 ML SYRINGE
INTRAMUSCULAR | Status: AC
  Filled 2021-04-14: qty 5

## 2021-04-14 MED ORDER — LIDOCAINE-EPINEPHRINE 1 %-1:100000 IJ SOLN
INTRAMUSCULAR | Status: AC
  Filled 2021-04-14: qty 1

## 2021-04-14 MED ORDER — CEFAZOLIN SODIUM-DEXTROSE 2-4 GM/100ML-% IV SOLN
INTRAVENOUS | Status: AC
  Filled 2021-04-14: qty 100

## 2021-04-14 MED ORDER — CEPHALEXIN 250 MG/5ML PO SUSR: 500.0000 mg | Freq: Four times a day (QID) | ORAL | 0 refills | Status: AC

## 2021-04-14 SURGICAL SUPPLY — 59 items
BLADE CLIPPER SURG (BLADE) IMPLANT
BLADE SURG 15 STRL LF DISP TIS (BLADE) ×1 IMPLANT
BLADE SURG 15 STRL SS (BLADE)
CANISTER SUCT 1200ML W/VALVE (MISCELLANEOUS) ×2 IMPLANT
COVER BACK TABLE 60X90IN (DRAPES) ×2 IMPLANT
COVER MAYO STAND STRL (DRAPES) ×2 IMPLANT
DECANTER SPIKE VIAL GLASS SM (MISCELLANEOUS) ×2 IMPLANT
DRAPE U-SHAPE 76X120 STRL (DRAPES) ×2 IMPLANT
DRAPE UTILITY XL STRL (DRAPES) ×1 IMPLANT
ELECT COATED BLADE 2.86 ST (ELECTRODE) ×1 IMPLANT
ELECT REM PT RETURN 9FT ADLT (ELECTROSURGICAL) ×2
ELECTRODE REM PT RTRN 9FT ADLT (ELECTROSURGICAL) ×1 IMPLANT
GAUZE 4X4 16PLY ~~LOC~~+RFID DBL (SPONGE) ×1 IMPLANT
GLOVE SURG HYDRASOFT LTX SZ5.5 (GLOVE) ×1 IMPLANT
GLOVE SURG POLYISO LF SZ6.5 (GLOVE) ×1 IMPLANT
GLOVE SURG UNDER POLY LF SZ6.5 (GLOVE) ×1 IMPLANT
GOWN STRL REUS W/ TWL LRG LVL3 (GOWN DISPOSABLE) ×2 IMPLANT
GOWN STRL REUS W/TWL LRG LVL3 (GOWN DISPOSABLE) ×4
KIT TURNOVER KIT B (KITS) ×2 IMPLANT
NDL BLUNT 17GA (NEEDLE) ×1 IMPLANT
NDL HYPO 27GX1-1/4 (NEEDLE) ×1 IMPLANT
NEEDLE BLUNT 17GA (NEEDLE) IMPLANT
NEEDLE HYPO 27GX1-1/4 (NEEDLE) ×2 IMPLANT
NS IRRIG 1000ML POUR BTL (IV SOLUTION) ×2 IMPLANT
PACK BASIN DAY SURGERY FS (CUSTOM PROCEDURE TRAY) ×2 IMPLANT
PENCIL SMOKE EVACUATOR (MISCELLANEOUS) ×2 IMPLANT
PLATE HYBRID GOLD MMF (Plate) ×4 IMPLANT
PLATE HYBRID MMF GOLD (Plate) IMPLANT
SCISSORS WIRE ANG 4 3/4 DISP (INSTRUMENTS) ×2 IMPLANT
SCREW LOCK SELFDRIL 2.0X8M MMF (Screw) ×5 IMPLANT
SCREW LOCKING SELF DRILL 2.0X6 (Screw) ×4 IMPLANT
SLEEVE SCD COMPRESS KNEE MED (STOCKING) ×2 IMPLANT
STAPLER VISISTAT 35W (STAPLE) IMPLANT
SUT BONE WAX W31G (SUTURE) ×2 IMPLANT
SUT MON AB 3-0 SH 27 (SUTURE)
SUT MON AB 3-0 SH27 (SUTURE) IMPLANT
SUT MON AB 4-0 PC3 18 (SUTURE) IMPLANT
SUT PROLENE 5 0 PS 2 (SUTURE) IMPLANT
SUT PROLENE 6 0 PC 1 (SUTURE) IMPLANT
SUT SILK 2 0 PERMA HAND 18 BK (SUTURE) IMPLANT
SUT SILK 2 0 SH (SUTURE) ×3 IMPLANT
SUT STEEL 0 (SUTURE) ×4
SUT STEEL 0 18XMFL TIE 17 (SUTURE) IMPLANT
SUT STEEL 1 (SUTURE) IMPLANT
SUT STEEL 2 (SUTURE) ×3 IMPLANT
SUT STEEL 4 (SUTURE) IMPLANT
SUT VIC AB 4-0 RB1 27 (SUTURE)
SUT VIC AB 4-0 RB1 27X BRD (SUTURE) IMPLANT
SUT VIC AB 4-0 SH 27 (SUTURE)
SUT VIC AB 4-0 SH 27XANBCTRL (SUTURE) IMPLANT
SYR 50ML LL SCALE MARK (SYRINGE) ×1 IMPLANT
SYR CONTROL 10ML LL (SYRINGE) ×2 IMPLANT
TOOTHBRUSH ADULT (PERSONAL CARE ITEMS) IMPLANT
TOWEL GREEN STERILE FF (TOWEL DISPOSABLE) ×2 IMPLANT
TRAY DSU PREP LF (CUSTOM PROCEDURE TRAY) ×1 IMPLANT
TUBE CONNECTING 20X1/4 (TUBING) ×2 IMPLANT
TUBE SALEM SUMP 12R W/ARV (TUBING) ×2 IMPLANT
TUBE SALEM SUMP 16 FR W/ARV (TUBING) IMPLANT
YANKAUER SUCT BULB TIP NO VENT (SUCTIONS) ×2 IMPLANT

## 2021-04-14 NOTE — Transfer of Care (Signed)
Immediate Anesthesia Transfer of Care Note  Patient: Matthew Galvan  Procedure(s) Performed: Intermaxillary fixation (Bilateral: Mouth)  Patient Location: PACU  Anesthesia Type:General  Level of Consciousness: awake, alert  and oriented  Airway & Oxygen Therapy: Patient Spontanous Breathing and Patient connected to face mask oxygen  Post-op Assessment: Report given to RN and Post -op Vital signs reviewed and stable  Post vital signs: Reviewed and stable  Last Vitals:  Vitals Value Taken Time  BP 136/90 04/14/21 0843  Temp    Pulse 114 04/14/21 0845  Resp 23 04/14/21 0845  SpO2 100 % 04/14/21 0845  Vitals shown include unvalidated device data.  Last Pain:  Vitals:   04/14/21 0625  TempSrc: Oral  PainSc: 6       Patients Stated Pain Goal: 5 ([id number])  Complications: No notable events documented.

## 2021-04-14 NOTE — Anesthesia Procedure Notes (Signed)
Procedure Name: Intubation Date/Time: 04/14/2021 7:38 AM Performed by: Willa Frater, CRNA Pre-anesthesia Checklist: Patient identified, Emergency Drugs available, Suction available and Patient being monitored Patient Re-evaluated:Patient Re-evaluated prior to induction Oxygen Delivery Method: Circle system utilized Preoxygenation: Pre-oxygenation with 100% oxygen Induction Type: IV induction Ventilation: Mask ventilation without difficulty Laryngoscope Size: Glidescope, Mac and 3 Grade View: Grade II Nasal Tubes: Nasal prep performed, Nasal Rae, Right and Magill forceps - small, utilized Placement Confirmation: ETT inserted through vocal cords under direct vision, positive ETCO2 and breath sounds checked- equal and bilateral Secured at: 28 (R nare sewn in) cm Tube secured with: Tape Dental Injury: Teeth and Oropharynx as per pre-operative assessment

## 2021-04-14 NOTE — Anesthesia Postprocedure Evaluation (Signed)
Anesthesia Post Note  Patient: Matthew Galvan  Procedure(s) Performed: Intermaxillary fixation (Bilateral: Mouth)     Patient location during evaluation: PACU Anesthesia Type: General Level of consciousness: awake and alert Pain management: pain level controlled Vital Signs Assessment: post-procedure vital signs reviewed and stable Respiratory status: spontaneous breathing, nonlabored ventilation and respiratory function stable Cardiovascular status: blood pressure returned to baseline and stable Postop Assessment: no apparent nausea or vomiting Anesthetic complications: no   No notable events documented.  Last Vitals:  Vitals:   04/14/21 0945 04/14/21 1000  BP: 136/87   Pulse: 84   Resp: 11   Temp:  37.1 C  SpO2: 98%     Last Pain:  Vitals:   04/14/21 1000  TempSrc: Axillary  PainSc: 6                  Lowella Curb

## 2021-04-14 NOTE — Addendum Note (Signed)
Addendum  created 04/14/21 1042 by Ronnette Hila, CRNA   Charge Capture section accepted
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
## 2021-04-14 NOTE — Anesthesia Preprocedure Evaluation (Signed)
Anesthesia Evaluation  Patient identified by MRN, date of birth, ID band Patient awake    Reviewed: Allergy & Precautions, NPO status , Patient's Chart, lab work & pertinent test results  Airway Mallampati: II  TM Distance: >3 FB Neck ROM: Full    Dental no notable dental hx.    Pulmonary neg pulmonary ROS, Current Smoker and Patient abstained from smoking.,    Pulmonary exam normal breath sounds clear to auscultation       Cardiovascular negative cardio ROS Normal cardiovascular exam Rhythm:Regular Rate:Normal     Neuro/Psych negative neurological ROS  negative psych ROS   GI/Hepatic negative GI ROS, Neg liver ROS,   Endo/Other  negative endocrine ROS  Renal/GU negative Renal ROS  negative genitourinary   Musculoskeletal negative musculoskeletal ROS (+)   Abdominal   Peds negative pediatric ROS (+)  Hematology negative hematology ROS (+)   Anesthesia Other Findings   Reproductive/Obstetrics negative OB ROS                             Anesthesia Physical Anesthesia Plan  ASA: 2  Anesthesia Plan: General   Post-op Pain Management:    Induction: Intravenous  PONV Risk Score and Plan: 1 and Ondansetron and Treatment may vary due to age or medical condition  Airway Management Planned: Oral ETT  Additional Equipment:   Intra-op Plan:   Post-operative Plan: Extubation in OR  Informed Consent: I have reviewed the patients History and Physical, chart, labs and discussed the procedure including the risks, benefits and alternatives for the proposed anesthesia with the patient or authorized representative who has indicated his/her understanding and acceptance.     Dental advisory given  Plan Discussed with: CRNA  Anesthesia Plan Comments:         Anesthesia Quick Evaluation

## 2021-04-14 NOTE — Discharge Instructions (Signed)

## 2021-04-14 NOTE — Interval H&P Note (Signed)
History and Physical Interval Note:  04/14/2021 6:48 AM  [name]  has presented today for surgery, with the diagnosis of open fracture right mandibular body closed fracture left ramus.  The various methods of treatment have been discussed with the patient and family. After consideration of risks, benefits and other options for treatment, the patient has consented to  Procedure(s): intermaxillary fixation (N/A) as a surgical intervention.  The patient's history has been reviewed, patient examined, no change in status, stable for surgery.  I have reviewed the patient's chart and labs.  Questions were answered to the patient's satisfaction.     Irean Hong Sabriyah Wilcher
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
## 2021-04-15 ENCOUNTER — Encounter (HOSPITAL_BASED_OUTPATIENT_CLINIC_OR_DEPARTMENT_OTHER): Payer: Self-pay | Admitting: Plastic Surgery

## 2021-04-15 NOTE — Progress Notes (Signed)
Unable to leave a message.
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
## 2021-05-18 ENCOUNTER — Encounter (HOSPITAL_BASED_OUTPATIENT_CLINIC_OR_DEPARTMENT_OTHER): Payer: Self-pay | Admitting: Plastic Surgery

## 2021-05-18 ENCOUNTER — Other Ambulatory Visit: Payer: Self-pay

## 2021-05-18 NOTE — Progress Notes (Signed)
Reviewed pt with Dr Salvadore Farber- ok for surgery to be done at [facility]

## 2021-05-22 ENCOUNTER — Ambulatory Visit (HOSPITAL_BASED_OUTPATIENT_CLINIC_OR_DEPARTMENT_OTHER): Payer: No Typology Code available for payment source | Admitting: Anesthesiology

## 2021-05-22 ENCOUNTER — Encounter (HOSPITAL_BASED_OUTPATIENT_CLINIC_OR_DEPARTMENT_OTHER): Payer: Self-pay | Admitting: Plastic Surgery

## 2021-05-22 ENCOUNTER — Other Ambulatory Visit: Payer: Self-pay

## 2021-05-22 ENCOUNTER — Ambulatory Visit (HOSPITAL_BASED_OUTPATIENT_CLINIC_OR_DEPARTMENT_OTHER)
Admission: RE | Admit: 2021-05-22 | Discharge: 2021-05-22 | Disposition: A | Discharge status: Home / Self Care | Payer: No Typology Code available for payment source | Attending: Plastic Surgery | Admitting: Plastic Surgery

## 2021-05-22 ENCOUNTER — Encounter (HOSPITAL_BASED_OUTPATIENT_CLINIC_OR_DEPARTMENT_OTHER)
Admission: RE | Disposition: A | Discharge status: Home / Self Care | Payer: Self-pay | Source: Home / Self Care | Attending: Plastic Surgery

## 2021-05-22 DIAGNOSIS — S02601D Fracture of unspecified part of body of right mandible, subsequent encounter for fracture with routine healing: Secondary | ICD-10-CM | POA: Insufficient documentation

## 2021-05-22 DIAGNOSIS — S02642D Fracture of ramus of left mandible, subsequent encounter for fracture with routine healing: Secondary | ICD-10-CM | POA: Insufficient documentation

## 2021-05-22 DIAGNOSIS — Z472 Encounter for removal of internal fixation device: Secondary | ICD-10-CM | POA: Insufficient documentation

## 2021-05-22 DIAGNOSIS — F17200 Nicotine dependence, unspecified, uncomplicated: Secondary | ICD-10-CM | POA: Insufficient documentation

## 2021-05-22 HISTORY — PX: HARDWARE REMOVAL: SHX979

## 2021-05-22 HISTORY — DX: Other complications of anesthesia, initial encounter: T88.59XA

## 2021-05-22 SURGERY — REMOVAL, HARDWARE
Anesthesia: Monitor Anesthesia Care | Site: Mouth

## 2021-05-22 MED ORDER — ONDANSETRON HCL 4 MG/2ML IJ SOLN
INTRAMUSCULAR | Status: DC | PRN
  Administered 2021-05-22: 4 mg via INTRAVENOUS

## 2021-05-22 MED ORDER — FENTANYL CITRATE (PF) 100 MCG/2ML IJ SOLN
INTRAMUSCULAR | Status: DC | PRN
  Administered 2021-05-22: 100 ug via INTRAVENOUS

## 2021-05-22 MED ORDER — PROPOFOL 500 MG/50ML IV EMUL
INTRAVENOUS | Status: DC | PRN
  Administered 2021-05-22: 150 ug/kg/min via INTRAVENOUS

## 2021-05-22 MED ORDER — CEFAZOLIN SODIUM-DEXTROSE 2-4 GM/100ML-% IV SOLN
INTRAVENOUS | Status: AC
  Filled 2021-05-22: qty 100

## 2021-05-22 MED ORDER — OXYCODONE HCL 5 MG/5ML PO SOLN: 5.0000 mg | Freq: Once | ORAL | Status: DC | PRN

## 2021-05-22 MED ORDER — KETOROLAC TROMETHAMINE 30 MG/ML IJ SOLN: 30.0000 mg | Freq: Once | INTRAMUSCULAR | Status: DC

## 2021-05-22 MED ORDER — PROPOFOL 10 MG/ML IV BOLUS
INTRAVENOUS | Status: DC | PRN
  Administered 2021-05-22 (×2): 50 mg via INTRAVENOUS

## 2021-05-22 MED ORDER — CEFAZOLIN SODIUM-DEXTROSE 2-4 GM/100ML-% IV SOLN
2.0000 g | INTRAVENOUS | Status: AC
  Administered 2021-05-22: 2 g via INTRAVENOUS

## 2021-05-22 MED ORDER — ACETAMINOPHEN 10 MG/ML IV SOLN: 1000.0000 mg | Freq: Once | INTRAVENOUS | Status: DC | PRN

## 2021-05-22 MED ORDER — PROPOFOL 10 MG/ML IV BOLUS
INTRAVENOUS | Status: AC
  Filled 2021-05-22: qty 20

## 2021-05-22 MED ORDER — DEXMEDETOMIDINE (PRECEDEX) IN NS 20 MCG/5ML (4 MCG/ML) IV SYRINGE
PREFILLED_SYRINGE | INTRAVENOUS | Status: DC | PRN
Start: 2021-05-22 — End: 2021-05-22
  Administered 2021-05-22: 12 ug via INTRAVENOUS
  Administered 2021-05-22: 8 ug via INTRAVENOUS

## 2021-05-22 MED ORDER — AMISULPRIDE (ANTIEMETIC) 5 MG/2ML IV SOLN: 10.0000 mg | Freq: Once | INTRAVENOUS | Status: DC | PRN

## 2021-05-22 MED ORDER — MIDAZOLAM HCL 2 MG/2ML IJ SOLN
INTRAMUSCULAR | Status: AC
  Filled 2021-05-22: qty 2

## 2021-05-22 MED ORDER — PROMETHAZINE HCL 25 MG/ML IJ SOLN: 6.2500 mg | INTRAMUSCULAR | Status: DC | PRN

## 2021-05-22 MED ORDER — FENTANYL CITRATE (PF) 100 MCG/2ML IJ SOLN
INTRAMUSCULAR | Status: AC
  Filled 2021-05-22: qty 2

## 2021-05-22 MED ORDER — OXYCODONE HCL 5 MG PO TABS: 5.0000 mg | ORAL_TABLET | Freq: Once | ORAL | Status: DC | PRN

## 2021-05-22 MED ORDER — LACTATED RINGERS IV SOLN: INTRAVENOUS | Status: DC

## 2021-05-22 MED ORDER — MIDAZOLAM HCL 5 MG/5ML IJ SOLN
INTRAMUSCULAR | Status: DC | PRN
  Administered 2021-05-22: 2 mg via INTRAVENOUS

## 2021-05-22 MED ORDER — LIDOCAINE-EPINEPHRINE 1 %-1:100000 IJ SOLN
INTRAMUSCULAR | Status: DC | PRN
  Administered 2021-05-22: 4 mL

## 2021-05-22 MED ORDER — FENTANYL CITRATE (PF) 100 MCG/2ML IJ SOLN: 25.0000 ug | INTRAMUSCULAR | Status: DC | PRN

## 2021-05-22 SURGICAL SUPPLY — 25 items
BLADE SURG 15 STRL LF DISP TIS (BLADE) ×1 IMPLANT
BLADE SURG 15 STRL SS (BLADE) ×2
CANISTER SUCT 1200ML W/VALVE (MISCELLANEOUS) ×2 IMPLANT
COVER MAYO STAND STRL (DRAPES) ×1 IMPLANT
DRAPE UTILITY XL STRL (DRAPES) ×2 IMPLANT
ELECT COATED BLADE 2.86 ST (ELECTRODE) IMPLANT
ELECT REM PT RETURN 9FT ADLT (ELECTROSURGICAL)
ELECTRODE REM PT RTRN 9FT ADLT (ELECTROSURGICAL) IMPLANT
GLOVE SURG HYDRASOFT LTX SZ5.5 (GLOVE) ×3 IMPLANT
GOWN STRL REUS W/ TWL LRG LVL3 (GOWN DISPOSABLE) ×1 IMPLANT
GOWN STRL REUS W/TWL LRG LVL3 (GOWN DISPOSABLE) ×4
MARKER SKIN DUAL TIP RULER LAB (MISCELLANEOUS) IMPLANT
NDL HYPO 27GX1-1/4 (NEEDLE) ×1 IMPLANT
NEEDLE HYPO 27GX1-1/4 (NEEDLE) ×2 IMPLANT
PACK BASIN DAY SURGERY FS (CUSTOM PROCEDURE TRAY) ×2 IMPLANT
PENCIL SMOKE EVACUATOR (MISCELLANEOUS) IMPLANT
SHEET MEDIUM DRAPE 40X70 STRL (DRAPES) ×2 IMPLANT
SPIKE FLUID TRANSFER (MISCELLANEOUS) ×1 IMPLANT
SUT CHROMIC 3 0 PS 2 (SUTURE) IMPLANT
SUT CHROMIC 4 0 PS 2 18 (SUTURE) IMPLANT
SYR CONTROL 10ML LL (SYRINGE) ×2 IMPLANT
TOWEL GREEN STERILE FF (TOWEL DISPOSABLE) ×2 IMPLANT
TRAY DSU PREP LF (CUSTOM PROCEDURE TRAY) IMPLANT
TUBE CONNECTING 20X1/4 (TUBING) ×2 IMPLANT
YANKAUER SUCT BULB TIP NO VENT (SUCTIONS) ×2 IMPLANT

## 2021-05-22 NOTE — Op Note (Signed)
Operative Note   DATE OF OPERATION: 2.17.23  LOCATION: [facility]-outpatient  SURGICAL DIVISION: Plastic Surgery  PREOPERATIVE DIAGNOSES:  1. Closed left mandibular ramus fracture 2. Right mandibular body fracture open  POSTOPERATIVE DIAGNOSES:  same  PROCEDURE:  Removal intermaxillary fixation  SURGEON: Irene Limbo MD MBA  ASSISTANT: none  ANESTHESIA:  MAC   EBL: minimal  COMPLICATIONS: None immediate.   INDICATIONS FOR PROCEDURE:  The patient, Matthew Galvan, is a 26 y.o. male born on 1995-11-15, is here for removal intermaxillary fixation.   FINDINGS:  Multiple caries noted of multiple teeth. Following removal fixation, no open bites no cross bites.  DESCRIPTION OF PROCEDURE:  The patient was taken to the operating room. IV antibiotics were given. The patient's operative site was draped in usual fashion. A time out was performed and all information was confirmed to be correct. Local anesthetic infiltrated to perform bilateral intraorbital and mental nerve blocks. Remaining intermaxillary wires removed. Bridle wire around teeth at right body fracture site removed. Incision made in gingiva over each screw. A total of 8 screws removed from maxilla and mandible. Hybrid intermaxillary system arch bars removed.  The patient was allowed to wake from anesthesia and taken to the recovery room in satisfactory condition.   SPECIMENS: none  DRAINS: none

## 2021-05-22 NOTE — Anesthesia Preprocedure Evaluation (Signed)
Anesthesia Evaluation  Patient identified by MRN, date of birth, ID band Patient awake    Reviewed: Allergy & Precautions, NPO status , Patient's Chart, lab work & pertinent test results  Airway Mallampati: Unable to assess  TM Distance: >3 FB Neck ROM: Full    Dental no notable dental hx.    Pulmonary Current SmokerPatient did not abstain from smoking.,    Pulmonary exam normal        Cardiovascular negative cardio ROS Normal cardiovascular exam     Neuro/Psych PSYCHIATRIC DISORDERS negative neurological ROS     GI/Hepatic negative GI ROS, Neg liver ROS,   Endo/Other  negative endocrine ROS  Renal/GU negative Renal ROS     Musculoskeletal negative musculoskeletal ROS (+)   Abdominal   Peds  Hematology negative hematology ROS (+)   Anesthesia Other Findings closed left mandibular ramus fracture right mandibular body fracture open  Reproductive/Obstetrics                             Anesthesia Physical Anesthesia Plan  ASA: 2  Anesthesia Plan: MAC   Post-op Pain Management:    Induction: Intravenous  PONV Risk Score and Plan: 0 and Ondansetron, Dexamethasone, Midazolam and Treatment may vary due to age or medical condition  Airway Management Planned: Nasal Cannula  Additional Equipment:   Intra-op Plan:   Post-operative Plan:   Informed Consent: I have reviewed the patients History and Physical, chart, labs and discussed the procedure including the risks, benefits and alternatives for the proposed anesthesia with the patient or authorized representative who has indicated his/her understanding and acceptance.     Dental advisory given  Plan Discussed with: CRNA  Anesthesia Plan Comments:         Anesthesia Quick Evaluation

## 2021-05-22 NOTE — Anesthesia Postprocedure Evaluation (Signed)
Anesthesia Post Note  Patient: Matthew Galvan  Procedure(s) Performed: REMOVAL IMF (Mouth)     Patient location during evaluation: PACU Anesthesia Type: MAC Level of consciousness: awake Pain management: pain level controlled Vital Signs Assessment: post-procedure vital signs reviewed and stable Respiratory status: spontaneous breathing, nonlabored ventilation, respiratory function stable and patient connected to nasal cannula oxygen Cardiovascular status: stable and blood pressure returned to baseline Postop Assessment: no apparent nausea or vomiting Anesthetic complications: no   No notable events documented.  Last Vitals:  Vitals:   05/22/21 1015 05/22/21 1057  BP: 107/64 110/77  Pulse: 73 73  Resp: 13 16  Temp:  36.5 C  SpO2: 96% 96%    Last Pain:  Vitals:   05/22/21 1057  TempSrc:   PainSc: 0-No pain                 Matthew Galvan P Timouthy Gilardi

## 2021-05-22 NOTE — Progress Notes (Signed)
Spoke with pt's mom Britta Mccreedy) who will transport pt and provide care at home. Pt said "I don't want to go to Mom's house, I want to go to Papaw's house." Called pt's grandfather, who said he will transport pt to his house and provide 24 hr care. Mother Britta Mccreedy) also called back to inform us grandfather is coming for patient and will arrive around 75. Anesthesia (Dr Bradley Ferris) aware of change in transportation/caregiver.

## 2021-05-22 NOTE — Progress Notes (Signed)
Patient given cell phone back prior to discharge. Patient holding phone in hand getting into grandfather's car.

## 2021-05-22 NOTE — Discharge Instructions (Signed)

## 2021-05-22 NOTE — Transfer of Care (Signed)
Immediate Anesthesia Transfer of Care Note  Patient: Matthew Galvan  Procedure(s) Performed: REMOVAL IMF (Mouth)  Patient Location: PACU  Anesthesia Type:MAC  Level of Consciousness: sedated  Airway & Oxygen Therapy: Patient Spontanous Breathing and Patient connected to nasal cannula oxygen  Post-op Assessment: Report given to RN and Post -op Vital signs reviewed and stable  Post vital signs: Reviewed and stable  Last Vitals:  Vitals Value Taken Time  BP 118/68 05/22/21 0938  Temp    Pulse 81 05/22/21 0941  Resp 15 05/22/21 0941  SpO2 99 % 05/22/21 0941  Vitals shown include unvalidated device data.  Last Pain:  Vitals:   05/22/21 0845  TempSrc: Oral  PainSc: 4       Patients Stated Pain Goal: 7 (86/48/47 2072)  Complications: No notable events documented.
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
## 2021-05-25 ENCOUNTER — Encounter (HOSPITAL_BASED_OUTPATIENT_CLINIC_OR_DEPARTMENT_OTHER): Payer: Self-pay | Admitting: Plastic Surgery

## 2021-05-25 NOTE — Progress Notes (Signed)
No answer

## 2021-06-23 ENCOUNTER — Ambulatory Visit (INDEPENDENT_AMBULATORY_CARE_PROVIDER_SITE_OTHER): Payer: No Typology Code available for payment source | Admitting: Nurse Practitioner

## 2021-06-23 ENCOUNTER — Encounter: Payer: Self-pay | Admitting: Nurse Practitioner

## 2021-06-23 ENCOUNTER — Ambulatory Visit (INDEPENDENT_AMBULATORY_CARE_PROVIDER_SITE_OTHER): Payer: No Typology Code available for payment source

## 2021-06-23 VITALS — BP 125/68 | HR 88 | Temp 99.4°F | Ht 73.0 in | Wt 173.0 lb

## 2021-06-23 DIAGNOSIS — H0100B Unspecified blepharitis left eye, upper and lower eyelids: Secondary | ICD-10-CM

## 2021-06-23 DIAGNOSIS — H0100A Unspecified blepharitis right eye, upper and lower eyelids: Secondary | ICD-10-CM | POA: Diagnosis not present

## 2021-06-23 DIAGNOSIS — R0781 Pleurodynia: Secondary | ICD-10-CM

## 2021-06-23 MED ORDER — BACITRACIN-POLYMYXIN B 500-10000 UNIT/GM OP OINT: 1.0000 "application " | TOPICAL_OINTMENT | Freq: Two times a day (BID) | OPHTHALMIC | 0 refills | Status: DC

## 2021-06-23 MED ORDER — IBUPROFEN 600 MG PO TABS: 600.0000 mg | ORAL_TABLET | Freq: Three times a day (TID) | ORAL | 0 refills | Status: DC | PRN

## 2021-06-23 MED ORDER — PREDNISONE 10 MG (21) PO TBPK: ORAL_TABLET | ORAL | 0 refills | Status: DC

## 2021-06-23 MED ORDER — IBUPROFEN 600 MG PO TABS: 600.0000 mg | ORAL_TABLET | Freq: Three times a day (TID) | ORAL | 0 refills | Status: AC | PRN

## 2021-06-23 MED ORDER — PREDNISONE 10 MG (21) PO TBPK: ORAL_TABLET | ORAL | 0 refills | Status: AC

## 2021-06-23 NOTE — Patient Instructions (Signed)
Blepharitis ?Blepharitis is swelling of the eyelids. It can cause the eyes to feel dry or gritty. Other symptoms may include: ?Reddish, scaly skin around the scalp and eyebrows. ?Eyelids that itch or burn. ?Fluid that leaks from the eye at night. This causes the eyelashes to stick together in the morning. ?Eyelashes that fall out. ?Redness of the eyes. ?Eyes that are sensitive to light. ?Follow these instructions at home: ?Watch for any changes in how your eyes look or feel. Tell your doctor about any changes. Follow these instructions to help with your condition. ?Keeping clean ?Wash your hands often with soap and water for at least 20 seconds. ?Clean your eyes. Wash the edges of your eyelids using eyelid wipes or a small amount of baby shampoo that has been mixed with warm water (diluted). Do this 2 or more times a day. ?Wash your face and eyebrows at least once a day. ?Use a clean towel each time you dry your eyelids. ?Do not use the towel to clean or dry other areas of your body. ?Do not share your towel with anyone. ?General instructions ?Avoid wearing makeup until you get better. Do not share makeup with anyone. ?Avoid rubbing your eyes. ?Use a warm compress on your eyes for 5-10 minutes at a time. Do this 1 or 2 times a day, or as told by your doctor. You can use: ?A towel with warm water on it. ?A heating pad that can be warmed in the microwave. The pad should be very warm but not hot enough to burn the skin. ?If you were given an antibiotic cream or eye drops, use the medicine as told by your doctor. Do not stop using the medicine even if you feel better. ?Keep all follow-up visits. ?Contact a doctor if: ?Your eyelids feel hot. ?You have blisters on your eyelids. ?You have a rash on your eyelids. ?The swelling does not go away in 2-4 days. ?The swelling gets worse. ?Get help right away if: ?You have pain that gets worse or spreads to other parts of your face. ?You have redness that gets worse or spreads to  other parts of your face. ?You have changes in how you see (vision). ?You have pain when you look at lights or things that move. ?You have a fever. ?Summary ?Blepharitis is swelling of the eyelids. ?Watch for any changes in how your eyes look or feel. Tell your doctor about any changes. ?Follow home care instructions as told by your doctor. Wash your hands often with soap and water for at least 20 seconds. Avoid wearing makeup. Do not rub your eyes. ?Use a warm compress, creams, or eye drops as told by your doctor. ?Let your doctor know if you have changes in how you see, blisters or a rash on your eyelids, or other problems. ?This information is not intended to replace advice given to you by your health care provider. Make sure you discuss any questions you have with your health care provider. ?Document Revised: 04/23/2020 Document Reviewed: 04/23/2020 ?Elsevier Patient Education ? 2022 [employer] ?Chest Wall Pain ?Chest wall pain is pain in or around the bones and muscles of your chest. Chest wall pain may be caused by: ?An injury. ?Coughing a lot. ?Using your chest and arm muscles too much. ?Sometimes, the cause may not be known. This pain may take a few weeks or longer to get better. ?Follow these instructions at home: ?Managing pain, stiffness, and swelling ?If told, put ice on the painful area: ?Put ice  in a plastic bag. ?Place a towel between your skin and the bag. ?Leave the ice on for 20 minutes, 2-3 times a day. ? ?Activity ?Rest as told by your doctor. ?Avoid doing things that cause pain. This includes lifting heavy items. ?Ask your doctor what activities are safe for you. ?General instructions ? ?Take over-the-counter and prescription medicines only as told by your doctor. ?Do not use any products that contain nicotine or tobacco, such as cigarettes, e-cigarettes, and chewing tobacco. If you need help quitting, ask your doctor. ?Keep all follow-up visits as told by your doctor. This is  important. ?Contact a doctor if: ?You have a fever. ?Your chest pain gets worse. ?You have new symptoms. ?Get help right away if: ?You feel sick to your stomach (nauseous) or you throw up (vomit). ?You feel sweaty or light-headed. ?You have a cough with mucus from your lungs (sputum) or you cough up blood. ?You are short of breath. ?These symptoms may be an emergency. Do not wait to see if the symptoms will go away. Get medical help right away. Call your local emergency services (911 in the [place] Do not drive yourself to the hospital. ?Summary ?Chest wall pain is pain in or around the bones and muscles of your chest. ?It may be treated with ice, rest, and medicines. Your condition may also get better if you avoid doing things that cause pain. ?Contact a doctor if you have a fever, chest pain that gets worse, or new symptoms. ?Get help right away if you feel light-headed or you get short of breath. These symptoms may be an emergency. ?This information is not intended to replace advice given to you by your health care provider. Make sure you discuss any questions you have with your health care provider. ?Document Revised: 06/24/2020 Document Reviewed: 06/06/2020 ?Elsevier Patient Education ? 2022 [employer] ? ?
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
## 2022-04-21 ENCOUNTER — Emergency Department (HOSPITAL_COMMUNITY): Payer: Self-pay

## 2022-04-21 ENCOUNTER — Emergency Department (HOSPITAL_COMMUNITY)
Admission: EM | Admit: 2022-04-21 | Discharge: 2022-04-21 | Discharge status: Against Medical Advice | Payer: Self-pay | Attending: Emergency Medicine | Admitting: Emergency Medicine

## 2022-04-21 ENCOUNTER — Other Ambulatory Visit: Payer: Self-pay

## 2022-04-21 ENCOUNTER — Encounter (HOSPITAL_COMMUNITY): Payer: Self-pay | Admitting: Emergency Medicine

## 2022-04-21 DIAGNOSIS — Z5321 Procedure and treatment not carried out due to patient leaving prior to being seen by health care provider: Secondary | ICD-10-CM | POA: Insufficient documentation

## 2022-04-21 DIAGNOSIS — S0101XA Laceration without foreign body of scalp, initial encounter: Secondary | ICD-10-CM | POA: Insufficient documentation

## 2022-04-21 DIAGNOSIS — W25XXXA Contact with sharp glass, initial encounter: Secondary | ICD-10-CM | POA: Insufficient documentation

## 2022-04-21 NOTE — ED Notes (Addendum)
Called patient no answer.

## 2022-04-21 NOTE — ED Notes (Signed)
Called patient several times patient didn't answer  
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
## 2022-04-21 NOTE — ED Notes (Signed)
Patient was in bathroom.

## 2022-06-23 ENCOUNTER — Other Ambulatory Visit: Payer: Self-pay

## 2022-06-23 ENCOUNTER — Encounter (HOSPITAL_COMMUNITY): Payer: Self-pay | Admitting: Emergency Medicine

## 2022-06-23 ENCOUNTER — Ambulatory Visit (HOSPITAL_COMMUNITY)
Admission: EM | Admit: 2022-06-23 | Discharge: 2022-06-23 | Disposition: A | Discharge status: Home / Self Care | Payer: Self-pay | Attending: Emergency Medicine | Admitting: Emergency Medicine

## 2022-06-23 DIAGNOSIS — T1592XA Foreign body on external eye, part unspecified, left eye, initial encounter: Secondary | ICD-10-CM

## 2022-06-23 MED ORDER — ERYTHROMYCIN 5 MG/GM OP OINT: TOPICAL_OINTMENT | Freq: Four times a day (QID) | OPHTHALMIC | 0 refills | Status: AC

## 2022-06-23 MED ORDER — TETRACAINE HCL 0.5 % OP SOLN
OPHTHALMIC | Status: AC
Start: 2022-06-23 — End: ?
  Filled 2022-06-23: qty 4

## 2022-06-23 MED ORDER — FLUORESCEIN SODIUM 1 MG OP STRP
ORAL_STRIP | OPHTHALMIC | Status: AC
Start: 2022-06-23 — End: ?
  Filled 2022-06-23: qty 1
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
## 2022-06-23 NOTE — ED Triage Notes (Signed)
Reports there is a piece of metal in left eye.  May have happened yesterday or today.  Has been cutting up car metal.  Left eye is red. Reports left eye vision is blurry.  Eye is sensitive to sun light.    Has used eye drops, but no medication for pain

## 2022-06-23 NOTE — Discharge Instructions (Signed)
Follow up with Dr. Alanda Slim next week. Call his office in the morning to get an appointment set up.

## 2022-08-08 ENCOUNTER — Other Ambulatory Visit: Payer: Self-pay

## 2022-08-08 ENCOUNTER — Emergency Department (HOSPITAL_COMMUNITY)
Admission: EM | Admit: 2022-08-08 | Discharge: 2022-08-09 | Payer: Self-pay | Attending: Emergency Medicine | Admitting: Emergency Medicine

## 2022-08-08 DIAGNOSIS — Z008 Encounter for other general examination: Secondary | ICD-10-CM

## 2022-08-08 NOTE — ED Triage Notes (Addendum)
Pt arrives with [facility]. Per officer pt was at jail when he was "scanned", they  want to have him medically cleared because they are unsure if his "scan results" and they reported pt admitted to doing cocaine last night and they are worried he may end up overdosing.

## 2022-08-09 NOTE — Discharge Instructions (Signed)
Substance Abuse Treatment Programs ° °Intensive Outpatient Programs °[facility]     °[address]      °[place], [place]                   °[phone]      ° °[facility] °[address] °[place], [place] °[phone] ° °[facility]     °(Inpatient and outpatient)     °[address]           °[phone]   ° °[facility] °[phone] (Suboxone and Methadone) ° °[address]      °[place], [place] [address]      °[phone]      ° °[address] °[place], [place] °852-3033 ° °[facility] (Outpatient/Inpatient, Chemical)    °(insurance only) [phone]      °       °[facility] (Groups & Residential) °[place], [place] °[phone] ° °   °[facility]     °[address]     °[place], [place]      °[phone]      ° °[facility] (for caregivers and family) °[address] °[place], [place] °336-299-4655 ° ° ° ° ° °Residential Treatment Programs °[facility]      °[address], [place], [place] [address]  °([phone]      ° °[facility] °[address] [place], [place] [address] °[phone] ° °[facility]        °[phone]      ° °[facility] °1-800-659-3381 ° °[facility]             °[address]                                         °[place], [place]                                                °877-615-2722 or 336-784-9470                              ° °[facility] [place] °[address] °[place], [address] °[phone] ° °[facility]    °[address]      °[place], [place]     °[phone]      ° °[facility] °[address] °[place], [place] °336-285-9073 ° °[facility]   °[address]     °[place], [place] [address]     °[phone]      °Admissions: 8am-3pm M-F ° °[facility] °[address] °[place],  [place] °336-227-7417 ° °[facility]: Residential Program (90 Days)   °[place], [place]      °336-725-8389 or 800-758-6077    ° °[facility] °[place], [place] °(Walk in Hours over the weekend or by referral) ° °[facility] °[address], [place], [place] [address] °([phone] ° °Crisis Mobile: [facility]:  1-877-626-1772 (for crisis response 24 hours a day) °[facility] Hotline:      1-800-256-2452 °Outpatient Psychiatry and Counseling ° °[facility]: Mobile Crisis   Management 24 hours:  1-877-626-1772 ° °[facility] sliding scale fee and walk in schedule: M-F 8am-12pm/1pm-3pm °[address]  °[place], [place] [address] °[phone] ° °[facility] °[address] °[place], [place] [address] °[phone] ° °[facility] ([facility])- new patient walk-in appointments available Monday - Friday 8am -3pm.          °[address] °[place], [place] [address] °336-676-6840 or crisis line- 336-676-6905 ° °[facility] Intensive Outpatient Therapy Program °[address] °[place], [place] [address] °336-832-9804 ° °[facility]                  °Crisis Services      °[phone]      °[address]     °[place], [place] [address]                ° °[facility]   °[facility] °[phone] °[address] °[place], [place] [address] ° ° °[facility]          °[address]  °[place], [place] [address]       °([phone] ° °[facility] °[address] °[place], [place] [address] °336-292-1510 ° °[facility]    °[address]    °[place], [place] [address]     °[phone]      ° °Parish McKinney, MD     °[address]     °[place] [address]     °[phone]     °  °[facility] °[address] °[place] [address] ° °[facility]     °[address]     °[place], [place]      °336-542-2076      ° °[facility] °Shamsher Ahluwalia, MD °[address] °[place], [place] [address] °[phone] ° °[facility]     °[address]     °[place], [place] [address]     °336-274-1237      ° °[facility] °[address] °[place], [place] [address] °336-854-4450 °Resources for Temporary Residential Assistance/Crisis Centers ° °DAY CENTERS °[facility] °M-F 8am-3pm   °[address] [place], [place] [address]   336-332-0824 °Services include: laundry, barbering, support groups, case management, phone  & computer access, showers, AA/NA mtgs, mental health/substance abuse nurse, job skills class, disability information, VA assistance, spiritual classes, etc.  ° °HOMELESS SHELTERS ° °[facility]     °[facility]   °[address], [place]     °[phone]       °       °[facility] (women and children)       °[address] °[place], [place] [address] °336-275-0820 °[email or web address] for application and process °Application Required ° °[facility]   °[address]    °[place] [address]     °[phone]       °             °[facility] °[address] °[place], [place] [address] °[phone] °336-235-0363(schedule application appt.) °Application Required ° °[facility] (women only)    °[address]     °[place], [place] [address]     °336-884-1039      °  Intake starts 6pm daily °Need valid ID, SSC, & Police report °[facility] [place] °[address] °[place], [place] °336-881-5420 °Application Required ° °[facility] (men only)     °[address]      °[place], [place]     °[phone]      ° °[facility] °(Pregnant women only) °[address] °[place], [place] °336-275-0206 ° °[facility]      °[address]      °[place], [place] [address]     °[phone]      °       °[facility] °[address] °[place], [place] °336-723-1848 °90 day commitment/SA/Application process ° °[facility](men only)     °[address]     °[place], [place]     °[phone]       °Check-in at 7pm     °       °[facility] °[address] °[place], [place] [address] °336-248-6684 °Men/Women/Women and Children must be there by 7 pm ° °[facility] °[place],  °336-722-8721                ° °
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
## 2023-02-28 ENCOUNTER — Encounter (HOSPITAL_COMMUNITY): Payer: Self-pay | Admitting: *Deleted

## 2023-02-28 ENCOUNTER — Emergency Department (HOSPITAL_COMMUNITY): Payer: Self-pay

## 2023-02-28 ENCOUNTER — Emergency Department (HOSPITAL_COMMUNITY)
Admission: EM | Admit: 2023-02-28 | Discharge: 2023-02-28 | Disposition: A | Discharge status: Home / Self Care | Payer: Self-pay | Attending: Emergency Medicine | Admitting: Emergency Medicine

## 2023-02-28 ENCOUNTER — Telehealth (HOSPITAL_BASED_OUTPATIENT_CLINIC_OR_DEPARTMENT_OTHER): Payer: Self-pay | Admitting: General Practice

## 2023-02-28 ENCOUNTER — Other Ambulatory Visit: Payer: Self-pay

## 2023-02-28 DIAGNOSIS — S51801A Unspecified open wound of right forearm, initial encounter: Secondary | ICD-10-CM | POA: Insufficient documentation

## 2023-02-28 DIAGNOSIS — S59911A Unspecified injury of right forearm, initial encounter: Secondary | ICD-10-CM

## 2023-02-28 DIAGNOSIS — W2201XA Walked into wall, initial encounter: Secondary | ICD-10-CM | POA: Insufficient documentation

## 2023-02-28 MED ORDER — DOXYCYCLINE HYCLATE 100 MG PO CAPS: 100.0000 mg | ORAL_CAPSULE | Freq: Two times a day (BID) | ORAL | 0 refills | Status: AC

## 2023-02-28 MED ORDER — DOXYCYCLINE HYCLATE 100 MG PO TABS
100.0000 mg | ORAL_TABLET | Freq: Once | ORAL | Status: AC
  Administered 2023-02-28: 100 mg via ORAL
  Filled 2023-02-28: qty 1

## 2023-02-28 NOTE — ED Triage Notes (Signed)
Not in the waiting room

## 2023-02-28 NOTE — ED Provider Notes (Signed)
Matthew Galvan Provider Note   CSN: [id number] Arrival date & time: 02/28/23  0057     History  Chief Complaint  Patient presents with   Abscess   HPI Matthew Galvan is a 27 y.o. male presenting for wound in the right forearm.  States that 2 weeks ago he was moving a heavy piece of furniture impact his arm into the wall and hit "really hard".  He sustained a small abrasion to the right proximal forearm.  It was initially painful and swollen.  Later that week he started to notice some pustulant discharge.  He took some "leftover antibiotics" and stated that the pustulant discharge resolved.  States he is here because he feels like it still may be infected and he "ran out of antibiotics".  Denies IV drug use.  HPI     Home Medications Prior to Admission medications   Medication Sig Start Date End Date Taking? Authorizing Provider  doxycycline (VIBRAMYCIN) 100 MG capsule Take 1 capsule (100 mg total) by mouth 2 (two) times daily. 02/28/23  Yes Gareth Eagle, PA-C  erythromycin ophthalmic ointment Place into the left eye 4 (four) times daily. Place a 1/2 inch ribbon of ointment into the lower eyelid 06/23/22   Cathlyn Parsons, NP  ibuprofen (ADVIL) 600 MG tablet Take 1 tablet (600 mg total) by mouth every 8 (eight) hours as needed. 06/23/21   Daryll Drown, NP  predniSONE (STERAPRED UNI-PAK 21 TAB) 10 MG (21) TBPK tablet 6 tablets day 1, 5 tablet day 2, 4 tablet day 3, 3 tablet before, 2 tablet day 5, 1 tablet day 6 Patient not taking: Reported on 06/23/2022 06/23/21   Daryll Drown, NP      Allergies    Patient has no known allergies.    Review of Systems   See HPI for pertinent positives  Physical Exam Updated Vital Signs BP 138/75 (BP Location: Left Arm)   Pulse (!) 107   Temp 97.9 F (36.6 C) (Oral)   Resp 18   Ht 6\' 1"  (1.854 m)   Wt 82 kg   SpO2 99%   BMI 23.85 kg/m  Physical Exam Constitutional:      Appearance:  Normal appearance.  HENT:     Head: Normocephalic.     Nose: Nose normal.  Eyes:     Conjunctiva/sclera: Conjunctivae normal.  Pulmonary:     Effort: Pulmonary effort is normal.  Musculoskeletal:       Arms:  Neurological:     Mental Status: He is alert.  Psychiatric:        Mood and Affect: Mood normal.     ED Results / Procedures / Treatments   Labs (all labs ordered are listed, but only abnormal results are displayed) Labs Reviewed - No data to display  EKG None  Radiology DG Forearm Right  Result Date: 02/28/2023 CLINICAL DATA:  Pain and abscess on right elbow 2 and half weeks. EXAM: RIGHT FOREARM - 2 VIEW COMPARISON:  None Available. FINDINGS: No acute fracture or dislocation. No evidence of osteomyelitis. Focal soft tissue protrusion along the dorsal aspect of the proximal forearm. IMPRESSION: Focal soft tissue protrusion along the dorsal aspect of the proximal forearm. This is nonspecific and abscess is not excluded. Consider ultrasound for further evaluation. Electronically Signed   By: Minerva Fester M.D.   On: 02/28/2023 02:22    Procedures Procedures    Medications Ordered in ED Medications  doxycycline (  VIBRA-TABS) tablet 100 mg (100 mg Oral Given 02/28/23 0224)    ED Course/ Medical Decision Making/ A&P                                 Medical Decision Making Amount and/or Complexity of Data Reviewed Radiology: ordered.  Risk Prescription drug management.   27 year old well-appearing male presenting for wound on the right forearm.  Exam notable for erythematous wound about the right proximal forearm.  DDx includes fracture, dislocation, cellulitis, sepsis, other.  X-ray notable for soft tissue protrusion along the dorsal aspect of the proximal forearm.  Also inspected the wound with bedside ultrasound.  There was notable subcutaneous cobblestoning appearance but no significant underlying fluid collection.  Started on doxycycline.  Workup does not  suggest sepsis.  Advised follow-up PCP discussed pertinent return precautions.  Vital stable.  Discharged in good condition.        Final Clinical Impression(s) / ED Diagnoses Final diagnoses:  Injury of right forearm, initial encounter    Rx / DC Orders ED Discharge Orders          Ordered    doxycycline (VIBRAMYCIN) 100 MG capsule  2 times daily        02/28/23 0214              Gareth Eagle, PA-C 02/28/23 0258    Zadie Rhine, MD 02/28/23 [phone]

## 2023-02-28 NOTE — ED Provider Notes (Incomplete)
[place] EMERGENCY DEPARTMENT AT [facility] Provider Note   CSN: [id number] Arrival date & time: 02/28/23  9147     History {Add pertinent medical, surgical, social history, OB history to HPI:1} No chief complaint on file.  HPI Matthew Galvan is a 27 y.o. male presenting for wound in the right forearm.  States that 2 weeks ago he was moving a heavy piece of furniture impact his arm into the wall and hit "really hard".  He sustained a small abrasion to the right proximal forearm.  It was initially painful and swollen.  Later that week he started to notice some pustulant discharge.  He took some "leftover antibiotics" and the pustulant discharge resolved.  Denies states that the wound is more "hard".  Denies fever and chills.  States he is here because the wound is still painful and he does not know why.   HPI     Home Medications Prior to Admission medications   Medication Sig Start Date End Date Taking? Authorizing Provider  erythromycin ophthalmic ointment Place into the left eye 4 (four) times daily. Place a 1/2 inch ribbon of ointment into the lower eyelid 06/23/22   Cathlyn Parsons, NP  ibuprofen (ADVIL) 600 MG tablet Take 1 tablet (600 mg total) by mouth every 8 (eight) hours as needed. 06/23/21   Daryll Drown, NP  predniSONE (STERAPRED UNI-PAK 21 TAB) 10 MG (21) TBPK tablet 6 tablets day 1, 5 tablet day 2, 4 tablet day 3, 3 tablet before, 2 tablet day 5, 1 tablet day 6 Patient not taking: Reported on 06/23/2022 06/23/21   Daryll Drown, NP      Allergies    Patient has no known allergies.    Review of Systems   See HPI for pertinent positives  Physical Exam Updated Vital Signs BP 138/75 (BP Location: Left Arm)   Pulse (!) 107   Temp 97.9 F (36.6 C) (Oral)   Resp 18   Ht 6\' 1"  (1.854 m)   Wt 82 kg   SpO2 99%   BMI 23.85 kg/m  Physical Exam Constitutional:      Appearance: Normal appearance.  HENT:     Head: Normocephalic.     Nose: Nose normal.   Eyes:     Conjunctiva/sclera: Conjunctivae normal.  Pulmonary:     Effort: Pulmonary effort is normal.  Skin:    Comments: Wound about the lateral dorsal aspect of the right proximal forearm.  No palpable fluctuance.  Some mild surrounding erythema and induration. Not hot to touch.  Neurological:     Mental Status: He is alert.  Psychiatric:        Mood and Affect: Mood normal.     ED Results / Procedures / Treatments   Labs (all labs ordered are listed, but only abnormal results are displayed) Labs Reviewed - No data to display  EKG None  Radiology No results found.  Procedures Procedures  {Document cardiac monitor, telemetry assessment procedure when appropriate:1}  Medications Ordered in ED Medications - No data to display  ED Course/ Medical Decision Making/ A&P   {   Click here for ABCD2, HEART and other calculatorsREFRESH Note before signing :1}                              Medical Decision Making  ***  {Document critical care time when appropriate:1} {Document review of labs and clinical decision tools ie  heart score, Chads2Vasc2 etc:1}  {Document your independent review of radiology images, and any outside records:1} {Document your discussion with family members, caretakers, and with consultants:1} {Document social determinants of health affecting pt's care:1} {Document your decision making why or why not admission, treatments were needed:1} Final Clinical Impression(s) / ED Diagnoses Final diagnoses:  None    Rx / DC Orders ED Discharge Orders     None
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
## 2023-02-28 NOTE — Telephone Encounter (Signed)
Unable to reach patient Patient needs to f/u with PC from hospital referral

## 2023-02-28 NOTE — ED Triage Notes (Signed)
The pt has an abscess on his rt elbow area for 2.5 weeks he is c/o painful

## 2023-02-28 NOTE — Discharge Instructions (Signed)
Evaluation today was overall reassuring.  I am starting on doxycycline which is an antibiotic.  Please follow-up with PCP.  If you start to ooze pustulant discharge again, have a fever, the wound becomes more red or hot to touch would recommend that you return here for further evaluation.

## 2024-01-18 ENCOUNTER — Encounter (HOSPITAL_BASED_OUTPATIENT_CLINIC_OR_DEPARTMENT_OTHER): Payer: Self-pay | Admitting: Emergency Medicine

## 2024-01-18 ENCOUNTER — Emergency Department (HOSPITAL_BASED_OUTPATIENT_CLINIC_OR_DEPARTMENT_OTHER)
Admission: EM | Admit: 2024-01-18 | Discharge: 2024-01-18 | Disposition: A | Discharge status: Home / Self Care | Attending: Emergency Medicine | Admitting: Emergency Medicine

## 2024-01-18 ENCOUNTER — Emergency Department (HOSPITAL_BASED_OUTPATIENT_CLINIC_OR_DEPARTMENT_OTHER): Admitting: Radiology

## 2024-01-18 ENCOUNTER — Other Ambulatory Visit: Payer: Self-pay

## 2024-01-18 ENCOUNTER — Emergency Department (HOSPITAL_BASED_OUTPATIENT_CLINIC_OR_DEPARTMENT_OTHER)

## 2024-01-18 DIAGNOSIS — S0990XA Unspecified injury of head, initial encounter: Secondary | ICD-10-CM | POA: Diagnosis not present

## 2024-01-18 DIAGNOSIS — S0012XA Contusion of left eyelid and periocular area, initial encounter: Secondary | ICD-10-CM | POA: Diagnosis not present

## 2024-01-18 NOTE — ED Notes (Signed)
 Pt d/c instructions, medications, and follow-up care reviewed with pt. Pt verbalized understanding and had no further questions at time of d/c. Pt CA&Ox4, ambulatory, and in NAD at time of d/c
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
## 2024-01-18 NOTE — Discharge Instructions (Addendum)
 All imaging was reassuring today.  Please monitor for any worsening symptoms such as visual loss, severe weakness, difficulties walking or talking, or severe headaches.  If any of these or other concerning symptoms arise please return to the ED for further evaluation.  Please use Tylenol  and ibuprofen  as needed for pain.  Try to rest and do not partake in any contact sports or activities for the next few weeks.
